# Patient Record
Sex: Female | Born: 1990 | Race: White | Hispanic: No | Marital: Single | Smoking: Current every day smoker
Health system: Southern US, Community
[De-identification: ages and names within clinical notes are randomized; demographics above are authoritative.]

## PROBLEM LIST (undated history)

## (undated) DIAGNOSIS — B009 Herpesviral infection, unspecified: Secondary | ICD-10-CM

## (undated) DIAGNOSIS — F319 Bipolar disorder, unspecified: Secondary | ICD-10-CM

## (undated) DIAGNOSIS — Z9151 Personal history of suicidal behavior: Secondary | ICD-10-CM

## (undated) DIAGNOSIS — Z915 Personal history of self-harm: Secondary | ICD-10-CM

## (undated) DIAGNOSIS — R51 Headache: Secondary | ICD-10-CM

## (undated) DIAGNOSIS — F909 Attention-deficit hyperactivity disorder, unspecified type: Secondary | ICD-10-CM

## (undated) DIAGNOSIS — F419 Anxiety disorder, unspecified: Secondary | ICD-10-CM

## (undated) HISTORY — DX: Attention-deficit hyperactivity disorder, unspecified type: F90.9

## (undated) HISTORY — DX: Anxiety disorder, unspecified: F41.9

## (undated) HISTORY — DX: Headache: R51

## (undated) HISTORY — PX: HAND SURGERY: SHX662

## (undated) HISTORY — DX: Bipolar disorder, unspecified: F31.9

---
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
## 2012-01-07 ENCOUNTER — Ambulatory Visit (INDEPENDENT_AMBULATORY_CARE_PROVIDER_SITE_OTHER): Payer: Medicaid Other | Admitting: Behavioral Health

## 2012-01-07 DIAGNOSIS — F909 Attention-deficit hyperactivity disorder, unspecified type: Secondary | ICD-10-CM

## 2012-01-07 DIAGNOSIS — F902 Attention-deficit hyperactivity disorder, combined type: Secondary | ICD-10-CM

## 2012-01-07 DIAGNOSIS — F319 Bipolar disorder, unspecified: Secondary | ICD-10-CM

## 2012-01-08 ENCOUNTER — Encounter (HOSPITAL_COMMUNITY): Payer: Self-pay | Admitting: Behavioral Health
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
## 2012-01-21 ENCOUNTER — Ambulatory Visit (HOSPITAL_COMMUNITY): Payer: Medicaid Other | Admitting: Behavioral Health

## 2012-02-05 ENCOUNTER — Ambulatory Visit (INDEPENDENT_AMBULATORY_CARE_PROVIDER_SITE_OTHER): Payer: Medicaid Other | Admitting: Behavioral Health

## 2012-02-05 DIAGNOSIS — F3110 Bipolar disorder, current episode manic without psychotic features, unspecified: Secondary | ICD-10-CM

## 2012-02-06 ENCOUNTER — Encounter (HOSPITAL_COMMUNITY): Payer: Self-pay | Admitting: Behavioral Health
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
## 2012-02-22 ENCOUNTER — Encounter (HOSPITAL_COMMUNITY): Payer: Self-pay | Admitting: Behavioral Health

## 2012-02-22 ENCOUNTER — Ambulatory Visit (HOSPITAL_COMMUNITY): Payer: Self-pay | Admitting: Behavioral Health
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
## 2012-03-10 ENCOUNTER — Ambulatory Visit (HOSPITAL_COMMUNITY): Payer: Self-pay | Admitting: Behavioral Health

## 2012-03-17 ENCOUNTER — Ambulatory Visit (INDEPENDENT_AMBULATORY_CARE_PROVIDER_SITE_OTHER): Payer: Medicaid Other | Admitting: Behavioral Health

## 2012-03-17 DIAGNOSIS — F3110 Bipolar disorder, current episode manic without psychotic features, unspecified: Secondary | ICD-10-CM

## 2012-03-18 ENCOUNTER — Encounter (HOSPITAL_COMMUNITY): Payer: Self-pay | Admitting: Behavioral Health
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
## 2012-03-26 ENCOUNTER — Ambulatory Visit (INDEPENDENT_AMBULATORY_CARE_PROVIDER_SITE_OTHER): Payer: Medicaid Other | Admitting: Behavioral Health

## 2012-03-26 ENCOUNTER — Encounter (HOSPITAL_COMMUNITY): Payer: Self-pay | Admitting: Behavioral Health

## 2012-03-26 DIAGNOSIS — F3110 Bipolar disorder, current episode manic without psychotic features, unspecified: Secondary | ICD-10-CM
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
## 2012-04-03 ENCOUNTER — Ambulatory Visit (INDEPENDENT_AMBULATORY_CARE_PROVIDER_SITE_OTHER): Payer: Medicaid Other | Admitting: Psychiatry

## 2012-04-03 ENCOUNTER — Encounter (HOSPITAL_COMMUNITY): Payer: Self-pay | Admitting: Emergency Medicine

## 2012-04-03 ENCOUNTER — Emergency Department (HOSPITAL_COMMUNITY)
Admission: EM | Admit: 2012-04-03 | Discharge: 2012-04-04 | Disposition: A | Discharge status: Home / Self Care | Payer: Medicaid Other | Attending: Emergency Medicine | Admitting: Emergency Medicine

## 2012-04-03 ENCOUNTER — Telehealth (HOSPITAL_COMMUNITY): Payer: Self-pay | Admitting: Behavioral Health

## 2012-04-03 ENCOUNTER — Encounter (HOSPITAL_COMMUNITY): Payer: Self-pay | Admitting: Behavioral Health

## 2012-04-03 ENCOUNTER — Encounter (HOSPITAL_COMMUNITY): Payer: Self-pay | Admitting: Psychiatry

## 2012-04-03 VITALS — BP 113/84 | HR 81 | Ht 60.5 in | Wt 110.0 lb

## 2012-04-03 DIAGNOSIS — F17200 Nicotine dependence, unspecified, uncomplicated: Secondary | ICD-10-CM | POA: Insufficient documentation

## 2012-04-03 DIAGNOSIS — F4320 Adjustment disorder, unspecified: Secondary | ICD-10-CM

## 2012-04-03 DIAGNOSIS — F3130 Bipolar disorder, current episode depressed, mild or moderate severity, unspecified: Secondary | ICD-10-CM | POA: Insufficient documentation

## 2012-04-03 DIAGNOSIS — F411 Generalized anxiety disorder: Secondary | ICD-10-CM | POA: Insufficient documentation

## 2012-04-03 DIAGNOSIS — F3110 Bipolar disorder, current episode manic without psychotic features, unspecified: Secondary | ICD-10-CM

## 2012-04-03 DIAGNOSIS — F909 Attention-deficit hyperactivity disorder, unspecified type: Secondary | ICD-10-CM | POA: Insufficient documentation

## 2012-04-03 LAB — CBC
HCT: 40.6 % (ref 36.0–46.0)
Hemoglobin: 13.9 g/dL (ref 12.0–15.0)
MCHC: 34.2 g/dL (ref 30.0–36.0)
RBC: 4.78 MIL/uL (ref 3.87–5.11)
WBC: 11.2 10*3/uL — ABNORMAL HIGH (ref 4.0–10.5)

## 2012-04-03 LAB — COMPREHENSIVE METABOLIC PANEL
ALT: 10 U/L (ref 0–35)
Alkaline Phosphatase: 78 U/L (ref 39–117)
BUN: 8 mg/dL (ref 6–23)
CO2: 21 mEq/L (ref 19–32)
Chloride: 103 mEq/L (ref 96–112)
GFR calc Af Amer: >90 mL/min (ref >90)
GFR calc non Af Amer: >90 mL/min (ref >90)
Glucose, Bld: 116 mg/dL — ABNORMAL HIGH (ref 70–99)
Potassium: 3.5 mEq/L (ref 3.5–5.1)
Total Bilirubin: 0.4 mg/dL (ref 0.3–1.2)

## 2012-04-03 LAB — ETHANOL: Alcohol, Ethyl (B): <11 mg/dL (ref 0–11)

## 2012-04-03 LAB — PREGNANCY, URINE: Preg Test, Ur: NEGATIVE

## 2012-04-03 LAB — RAPID URINE DRUG SCREEN, HOSP PERFORMED: Barbiturates: NOT DETECTED

## 2012-04-03 MED ORDER — NICOTINE 21 MG/24HR TD PT24
21.0000 mg | MEDICATED_PATCH | Freq: Every day | TRANSDERMAL | Status: DC
  Administered 2012-04-03: 21 mg via TRANSDERMAL
  Filled 2012-04-03: qty 1

## 2012-04-03 MED ORDER — ACETAMINOPHEN 325 MG PO TABS: 650.0000 mg | ORAL_TABLET | ORAL | Status: DC | PRN

## 2012-04-03 MED ORDER — QUETIAPINE FUMARATE 50 MG PO TABS
50.0000 mg | ORAL_TABLET | Freq: Every day | ORAL | Status: DC
  Administered 2012-04-03: 50 mg via ORAL
  Filled 2012-04-03: qty 1

## 2012-04-03 MED ORDER — LITHIUM CARBONATE 300 MG PO CAPS
600.0000 mg | ORAL_CAPSULE | Freq: Every day | ORAL | Status: DC
  Administered 2012-04-03: 600 mg via ORAL
  Filled 2012-04-03: qty 2

## 2012-04-03 MED ORDER — ALUM & MAG HYDROXIDE-SIMETH 200-200-20 MG/5ML PO SUSP: 30.0000 mL | ORAL | Status: DC | PRN

## 2012-04-03 MED ORDER — ZOLPIDEM TARTRATE 5 MG PO TABS
5.0000 mg | ORAL_TABLET | Freq: Every evening | ORAL | Status: DC | PRN
  Administered 2012-04-03: 5 mg via ORAL
  Filled 2012-04-03: qty 1

## 2012-04-03 MED ORDER — SERTRALINE HCL 50 MG PO TABS
150.0000 mg | ORAL_TABLET | Freq: Every day | ORAL | Status: DC
  Administered 2012-04-03: 150 mg via ORAL
  Filled 2012-04-03: qty 3

## 2012-04-03 MED ORDER — ONDANSETRON HCL 4 MG PO TABS: 4.0000 mg | ORAL_TABLET | Freq: Three times a day (TID) | ORAL | Status: DC | PRN

## 2012-04-03 MED ORDER — IBUPROFEN 600 MG PO TABS: 600.0000 mg | ORAL_TABLET | Freq: Three times a day (TID) | ORAL | Status: DC | PRN
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
## 2012-04-03 NOTE — ED Notes (Signed)
Per pt increased depression since miscarriage 2 days ago-tried to kill self 2 months ago by cutting self

## 2012-04-03 NOTE — ED Notes (Signed)
Patient sitting up on side of the bed.

## 2012-04-03 NOTE — Telephone Encounter (Signed)
I received a message from the clients grandmother, Haley Wheeler, to call me back in regards to the client requesting to go to the hospital. I spoke with Dr.Puthevel who met with the client today for the first time. He indicated that there was some depression there do to the client miscarrying but indicated that he was no conversation about the client being suicidal and she did not mention hospitalization. I did call Ms. his wife back who indicated that the client told her mother and grandmother that she felt like she needed some additional help and that she felt she needed to get the help in the hospital. I then spoke to the client. She indicated that she was depressed after miscarrying but that she was not suicidal, was not having suicidal thoughts, was not participating in any self-injurious behavior and did contract for safety. I did instruct her that she would need to go to the emergency room for medical clearance and that she would be referred from the emergency room to the psychiatric or behavioral health hospital if they felt that would be beneficial to her. I suggested to home health but she indicated that she would prefer to go to [facility]. I asked her to please call and let me know what took place. Also attempted to prepare her for the fact that she may not be accepted to the behavioral health hospital. She indicated that she understood that and again contracted for safety.

## 2012-04-03 NOTE — ED Provider Notes (Signed)
Medical screening examination/treatment/procedure(s) were performed by non-physician practitioner and as supervising physician I was immediately available for consultation/collaboration.    Nelia Shi, MD 04/03/12 2037
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
## 2012-04-03 NOTE — ED Provider Notes (Signed)
History     CSN: [id number]  Arrival date & time 04/03/12  1752   First MD Initiated Contact with Patient 04/03/12 1839      Chief Complaint  Patient presents with  . V70.1    (Consider location/radiation/quality/duration/timing/severity/associated sxs/prior treatment) HPI  Pt presents to the ED requesting inpatient psychiatric treatment. She states that she is bipolar disorder. Has no medical complaints at this time. But states she cannot go home without some mental help. Her mother is here to support her. She was told that she had a miscarriage two days ago and that has aggravated her bipolar disorder.  The patient is not suicidal or homicidal at this time. NAD and VSS  Past Medical History  Diagnosis Date  . ADHD (attention deficit hyperactivity disorder)   . Anxiety   . Bipolar disorder     Past Surgical History  Procedure Date  . Hand surgery     Family History  Problem Relation Age of Onset  . Depression Mother   . ADD / ADHD Father   . Alcohol abuse Father   . ADD / ADHD Brother     History  Substance Use Topics  . Smoking status: Current Everyday Smoker -- 2.0 packs/day for 3 years    Types: Cigarettes  . Smokeless tobacco: Never Used   Comment: Pateint plans to get electronic cigarette  . Alcohol Use: No     Last drink-more that 5 drinks.    OB History    Grav Para Term Preterm Abortions TAB SAB Ect Mult Living                  Review of Systems   HEENT: denies blurry vision or change in hearing PULMONARY: Denies difficulty breathing and SOB CARDIAC: denies chest pain or heart palpitations MUSCULOSKELETAL:  denies being unable to ambulate ABDOMEN AL: denies abdominal pain GU: denies loss of bowel or urinary control NEURO: denies numbness and tingling in extremities      Allergies  Review of patient's allergies indicates no known allergies.  Home Medications   Current Outpatient Rx  Name Route Sig Dispense Refill  . LITHIUM  CARBONATE 300 MG PO CAPS Oral Take 600 mg by mouth at bedtime.    Marland Kitchen POTASSIUM CHLORIDE CRYS ER 10 MEQ PO TBCR Oral Take 10 mEq by mouth 2 (two) times daily.    Marland Kitchen PRENATAL MULTIVITAMIN CH Oral Take 1 tablet by mouth daily.    . QUETIAPINE FUMARATE 50 MG PO TABS Oral Take 50 mg by mouth at bedtime.    . SERTRALINE HCL 100 MG PO TABS Oral Take 150 mg by mouth daily.      BP 133/103  Pulse 91  Temp 98.4 F (36.9 C) (Oral)  Resp 17  Ht 5' (1.524 m)  Wt 108 lb (48.988 kg)  BMI 21.09 kg/m2  SpO2 100%  LMP 08/04/2011  Physical Exam  Nursing note and vitals reviewed. Constitutional: She appears well-developed and well-nourished. No distress.  HENT:  Head: Normocephalic and atraumatic.  Eyes: Pupils are equal, round, and reactive to light.  Neck: Normal range of motion. Neck supple.  Cardiovascular: Normal rate and regular rhythm.   Pulmonary/Chest: Effort normal.  Abdominal: Soft.  Neurological: She is alert.  Skin: Skin is warm and dry.  Psychiatric: She exhibits a depressed mood. She expresses no homicidal and no suicidal ideation. She expresses no suicidal plans and no homicidal plans.    ED Course  Procedures (including critical care time)  Labs Reviewed  CBC - Abnormal; Notable for the following:    WBC 11.2 (*)     All other components within normal limits  COMPREHENSIVE METABOLIC PANEL  ETHANOL  URINE RAPID DRUG SCREEN (HOSP PERFORMED)   No results found.   1. Depression       MDM  ACT to consult        Dorthula Matas, PA 04/03/12 1915  Dorthula Matas, PA 04/03/12 1918

## 2012-04-04 NOTE — ED Notes (Signed)
Patient discharge to home with written and verbal instructions. Respirations equal and unlabored. Skin warm and dry. No acute distress noted.

## 2012-04-04 NOTE — Discharge Instructions (Signed)
Follow up with the [facility] later this morning.  You may also use resource guide provided. Also follow up with primary care doctor in coming week.  Return to ER if worse, new symptoms, other concern.        Adjustment Disorder Most changes in life can cause stress. Getting used to changes may take a few months or longer. If feelings of stress, hopelessness, or worry continue, you may have an adjustment disorder. This stress-related mental health problem may affect your feelings, thinking and how you act. It occurs in both sexes and happens at any age. SYMPTOMS  Some of the following problems may be seen and vary from person to person:  Sadness or depression.   Loss of enjoyment.   Thoughts of suicide.   Fighting.   Avoiding family and friends.   Poor school performance.   Hopelessness, sense of loss.   Trouble sleeping.   Vandalism.   Worry, weight loss or gain.   Crying spells.   Anxiety   Reckless driving.   Skipping school.   Poor work International aid/development worker.   Nervousness.   Ignoring bills.   Poor attitude.  DIAGNOSIS  Your caregiver will ask what has happened in your life and do a physical exam. They will make a diagnosis of an adjustment disorder when they are sure another problem or medical illness causing your feelings does not exist. TREATMENT  When problems caused by stress interfere with you daily life or last longer than a few months, you may need counseling for an adjustment disorder. Early treatment may diminish problems and help you to better cope with the stressful events in your life. Sometimes medication is necessary. Individual counseling and or support groups can be very helpful. PROGNOSIS  Adjustment disorders usually last less than 3 to 6 months. The condition may persist if there is long lasting stress. This could include health problems, relationship problems, or job difficulties where you can not easily escape from what is causing the  problem. PREVENTION  Even the most mentally healthy, highly functioning people can suffer from an adjustment disorder given a significant blow from a life-changing event. There is no way to prevent pain and loss. Most people need help from time to time. You are not alone. SEEK MEDICAL CARE IF:  Your feelings or symptoms listed above do not improve or worsen. Document Released: 05/29/2006 Document Revised: 09/13/2011 Document Reviewed: 08/20/2007 [facility] Patient Information 2012 [place], [place].      Borderline Personality Disorder Borderline personality disorder is a mental health disorder. People with borderline personality disorder have unhealthy patterns of perceiving, thinking about, and reacting to their environment and events in their life. These patterns are established by adolescence or early adulthood. People with borderline personality disorder also have difficulty coping with stress on their own and fear being abandoned by others. They have difficulty controlling their emotions. Their emotions change quickly, frequently, and intensely. They are easily upset and can become very angry, very suddenly. Their unpredictable behavior often leads to problems in their relationships. They often feel worthless, unloved, and emotionally empty. CAUSES No one knows the exact cause of borderline personality disorder. Most mental health experts think that there is more than one cause. Possible contributing factors include:  Genetic factors. These are traits that are passed down from one generation to the next. Many people with borderline personality disorder have a family history of the disorder.   Physical factors. The part of the brain that controls emotion may be different in  people who have borderline personality disorder.   Social factors. Traumatic experiences involving other people may play a role in the development of borderline personality disorder. Examples include neglect, abandonment,  and physical and sexual abuse.  SYMPTOMS Signs and symptoms of borderline personality disorder include:  A series of unstable personal relationships.   A strong fear of being abandoned and frantic efforts to avoid abandonment.   Impulsive, self-destructive behavior, such as substance abuse, irrational spending of money, unprotected sex with multiple partners, reckless driving, and binge eating.   Poor self-image that also may change a lot, or a sense of identity that is inconsistent.   Recurring self-injury or attempted suicide.   Severe mood swings, including depression, irritability, and anxiety.   Lasting feelings of emptiness.   Difficulty controlling anger.   Temporary feelings of paranoia or loss of touch with reality.  DIAGNOSIS A diagnosis of borderline personality disorder requires the presence of at least 5 of the common signs and symptoms. This information is gathered from family and friends as well as medical professionals and legal professionals who have a close association with the patient. This information is also gathered during a psychiatric assessment. During the assessment, the patient is asked about early life experiences, level of education, employment status, physical health conditions, and current prescription and over-the-counter medicines used. TREATMENT Caregivers who usually treat borderline personality disorder are mental health professionals, such as psychologists, psychiatrists, and clinical social workers. More than one type of treatment may be needed. Types of treatment include:  Psychotherapy (also known as talk therapy or counseling).   Cognitive behavioral therapy. This helps the person to recognize and change unhealthy feelings, thoughts, and behaviors. They find new, more positive thoughts and actions to replace the old ones.   Dialectical behavioral therapy. Through this type of treatment, a person learns to understand his or her feelings and to  regulate them. This may be one-on-one treatment or part of group therapy.   Family therapy. This treatment includes family members.   Medicine. Medicine may be used to help control emotions, reduce reckless and self-destructive behavior, treat anxiety, and treat depression.  SEEK IMMEDIATE MEDICAL CARE IF:   You cannot control your behavior or emotions.   You think about hurting yourself.   You think about suicide.  FOR MORE INFORMATION National Alliance on Mental Illness: www.nami.org [place] [employer] of Mental Health: http://www.maynard.net/ Borderline Personality Resource Center: http://bpdresourcecenter.org Document Released: 01/09/2011 Document Revised: 09/13/2011 Document Reviewed: 01/09/2011 [facility] Patient Information 2012 [place], [place].     Depression, Adolescent and Adult Depression is a true and treatable medical condition. In general there are two kinds of depression:  Depression we all experience in some form. For example depression from the death of a loved one, financial distress or natural disasters will trigger or increase depression.   Clinical depression, on the other hand, appears without an apparent cause or reason. This depression is a disease. Depression may be caused by chemical imbalance in the body and brain or may come as a response to a physical illness. Alcohol and other drugs can cause depression.  DIAGNOSIS  The diagnosis of depression is usually based upon symptoms and medical history. TREATMENT  Treatments for depression fall into three categories. These are:  Drug therapy. There are many medicines that treat depression. Responses may vary and sometimes trial and error is necessary to determine the best medicines and dosage for a particular patient.   Psychotherapy, also called talking treatments, helps people resolve their problems  by looking at them from a different point of view and by giving people insight into their own personal makeup.  Traditional psychotherapy looks at a childhood source of a problem. Other psychotherapy will look at current conflicts and move toward solving those. If the cause of depression is drug use, counseling is available to help abstain. In time the depression will usually improve. If there were underlying causes for the chemical use, they can be addressed.   ECT (electroconvulsive therapy) or shock treatment is not as commonly used today. It is a very effective treatment for severe suicidal depression. During ECT electrical impulses are applied to the head. These impulses cause a generalized seizure. It can be effective but causes a loss of memory for recent events. Sometimes this loss of memory may include the last several months.  Treat all depression or suicide threats as serious. Obtain professional help. Do not wait to see if serious depression will get better over time without help. Seek help for yourself or those around you. In the [place] the number to the National Suicide Help Lines With 24 Hour Help Are: 1-800-SUICIDE 315 262 9102 Document Released: 09/21/2000 Document Revised: 09/13/2011 Document Reviewed: 05/12/2008 [facility] Patient Information 2012 [place], [place].

## 2012-04-04 NOTE — ED Notes (Signed)
Telepsych completed.  

## 2012-04-04 NOTE — ED Provider Notes (Signed)
Pt has been evaluated by psychiatrist, Dr Jacky Kindle, who states pt stable for d/c, recommends d/c to home with referral to community mental health follow up.   Suzi Roots, MD 04/04/12 [id number]

## 2012-04-08 ENCOUNTER — Encounter (HOSPITAL_COMMUNITY): Payer: Self-pay | Admitting: Emergency Medicine

## 2012-04-24 ENCOUNTER — Ambulatory Visit (HOSPITAL_COMMUNITY): Payer: Self-pay | Admitting: Behavioral Health

## 2012-04-30 ENCOUNTER — Encounter (HOSPITAL_COMMUNITY): Payer: Self-pay | Admitting: Behavioral Health

## 2012-04-30 ENCOUNTER — Ambulatory Visit (INDEPENDENT_AMBULATORY_CARE_PROVIDER_SITE_OTHER): Payer: 59 | Admitting: Behavioral Health

## 2012-04-30 DIAGNOSIS — F902 Attention-deficit hyperactivity disorder, combined type: Secondary | ICD-10-CM

## 2012-04-30 DIAGNOSIS — F909 Attention-deficit hyperactivity disorder, unspecified type: Secondary | ICD-10-CM

## 2012-04-30 DIAGNOSIS — F3110 Bipolar disorder, current episode manic without psychotic features, unspecified: Secondary | ICD-10-CM
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
## 2012-05-01 ENCOUNTER — Encounter (HOSPITAL_COMMUNITY): Payer: Self-pay | Admitting: Psychiatry

## 2012-05-01 ENCOUNTER — Ambulatory Visit (INDEPENDENT_AMBULATORY_CARE_PROVIDER_SITE_OTHER): Payer: Medicaid Other | Admitting: Psychiatry

## 2012-05-01 VITALS — BP 117/78 | HR 67 | Ht 60.5 in | Wt 106.0 lb

## 2012-05-01 DIAGNOSIS — F319 Bipolar disorder, unspecified: Secondary | ICD-10-CM

## 2012-05-01 DIAGNOSIS — F3110 Bipolar disorder, current episode manic without psychotic features, unspecified: Secondary | ICD-10-CM
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
## 2012-05-02 ENCOUNTER — Ambulatory Visit (HOSPITAL_COMMUNITY): Payer: Self-pay | Admitting: Behavioral Health

## 2012-05-14 ENCOUNTER — Ambulatory Visit (INDEPENDENT_AMBULATORY_CARE_PROVIDER_SITE_OTHER): Payer: 59 | Admitting: Behavioral Health

## 2012-05-14 ENCOUNTER — Ambulatory Visit (HOSPITAL_COMMUNITY): Payer: Self-pay | Admitting: Psychiatry

## 2012-05-14 DIAGNOSIS — F3110 Bipolar disorder, current episode manic without psychotic features, unspecified: Secondary | ICD-10-CM

## 2012-05-16 ENCOUNTER — Encounter (HOSPITAL_COMMUNITY): Payer: Self-pay | Admitting: Behavioral Health
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
## 2012-05-19 ENCOUNTER — Ambulatory Visit (INDEPENDENT_AMBULATORY_CARE_PROVIDER_SITE_OTHER): Payer: Medicaid Other | Admitting: Psychiatry

## 2012-05-19 ENCOUNTER — Encounter (HOSPITAL_COMMUNITY): Payer: Self-pay | Admitting: Psychiatry

## 2012-05-19 VITALS — BP 100/64 | HR 67 | Ht 60.5 in | Wt 106.0 lb

## 2012-05-19 DIAGNOSIS — F319 Bipolar disorder, unspecified: Secondary | ICD-10-CM

## 2012-05-19 MED ORDER — LITHIUM CARBONATE 300 MG PO CAPS: ORAL_CAPSULE | ORAL | Status: DC
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
## 2012-05-27 ENCOUNTER — Telehealth (HOSPITAL_COMMUNITY): Payer: Self-pay

## 2012-05-27 DIAGNOSIS — F319 Bipolar disorder, unspecified: Secondary | ICD-10-CM

## 2012-06-03 NOTE — Telephone Encounter (Signed)
Called patient. Advised her to come to lab to have lithium levels draw. Told her to come to the clinic to collect the lab form.

## 2012-06-04 ENCOUNTER — Emergency Department (INDEPENDENT_AMBULATORY_CARE_PROVIDER_SITE_OTHER)
Admission: EM | Admit: 2012-06-04 | Discharge: 2012-06-04 | Disposition: A | Discharge status: Home / Self Care | Payer: Medicaid Other | Source: Home / Self Care

## 2012-06-04 ENCOUNTER — Encounter: Payer: Self-pay | Admitting: *Deleted

## 2012-06-04 ENCOUNTER — Ambulatory Visit (INDEPENDENT_AMBULATORY_CARE_PROVIDER_SITE_OTHER): Payer: 59 | Admitting: Behavioral Health

## 2012-06-04 ENCOUNTER — Encounter (HOSPITAL_COMMUNITY): Payer: Self-pay | Admitting: Behavioral Health

## 2012-06-04 DIAGNOSIS — F3110 Bipolar disorder, current episode manic without psychotic features, unspecified: Secondary | ICD-10-CM

## 2012-06-04 DIAGNOSIS — B373 Candidiasis of vulva and vagina: Secondary | ICD-10-CM

## 2012-06-04 DIAGNOSIS — N390 Urinary tract infection, site not specified: Secondary | ICD-10-CM

## 2012-06-04 LAB — POCT URINALYSIS DIP (MANUAL ENTRY)
Blood, UA: NEGATIVE
Glucose, UA: NEGATIVE
Ketones, POC UA: NEGATIVE
Spec Grav, UA: 1.02 (ref 1.005–1.03)
Urobilinogen, UA: 1 (ref 0–1)

## 2012-06-04 MED ORDER — FLUCONAZOLE 150 MG PO TABS: 150.0000 mg | ORAL_TABLET | Freq: Once | ORAL | Status: DC

## 2012-06-04 MED ORDER — NITROFURANTOIN MONOHYD MACRO 100 MG PO CAPS: 100.0000 mg | ORAL_CAPSULE | Freq: Two times a day (BID) | ORAL | Status: AC

## 2012-06-04 MED ORDER — FLUCONAZOLE 150 MG PO TABS: 150.0000 mg | ORAL_TABLET | Freq: Once | ORAL | Status: AC
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
## 2012-06-04 NOTE — ED Notes (Signed)
Pt c/o dysuria, vaginal d/c and foul odor of her urine x 2 days. No OTC meds.

## 2012-06-04 NOTE — ED Provider Notes (Signed)
Agree with exam, assessment, and plan.   Lattie Haw, MD 06/04/12 1929

## 2012-06-04 NOTE — ED Provider Notes (Signed)
History     CSN: [id number]  Arrival date & time 06/04/12  1424      Chief Complaint  Patient presents with  . Dysuria  . Vaginal Itching    HPI Comments: Patient presents today with a complain of dysuria, cloudy urine, and foul odor to urine for 2 days.  She also has a complaint of yellowish vaginal dischage and itching for 2 days as well.  She denies any fever, chills.  Denies lower back pain and lower abdominal pain.  LMP - 7 days ago.  She is sexually active.  She relates no symptoms noted on partner.  The history is provided by the patient.    Past Medical History  Diagnosis Date  . ADHD (attention deficit hyperactivity disorder)   . Anxiety   . Bipolar disorder     Past Surgical History  Procedure Date  . Hand surgery     Family History  Problem Relation Age of Onset  . Depression Mother   . ADD / ADHD Father   . Alcohol abuse Father   . ADD / ADHD Brother     History  Substance Use Topics  . Smoking status: Current Everyday Smoker -- 2.0 packs/day for 3 years    Types: Cigarettes  . Smokeless tobacco: Never Used   Comment: Patient plans to get electronic cigarette  . Alcohol Use: No     12/wk    OB History    Grav Para Term Preterm Abortions TAB SAB Ect Mult Living                  Review of Systems  Constitutional: Negative.   HENT: Negative.   Eyes: Negative.   Cardiovascular: Negative.   Gastrointestinal: Negative for nausea and abdominal pain.  Genitourinary: Positive for dysuria and vaginal discharge. Negative for frequency, hematuria, flank pain, difficulty urinating and pelvic pain.       Yellowish vaginal discharge for 2 days.  All other systems reviewed and are negative.    Allergies  Review of patient's allergies indicates no known allergies.  Home Medications   Current Outpatient Rx  Name Route Sig Dispense Refill  . FLUCONAZOLE 150 MG PO TABS Oral Take 1 tablet (150 mg total) by mouth once. 1 tablet 1  . LITHIUM CARBONATE  300 MG PO CAPS  Take one tablet in the morning and two tablet at bedtime. 90 capsule 1  . NITROFURANTOIN MONOHYD MACRO 100 MG PO CAPS Oral Take 1 capsule (100 mg total) by mouth 2 (two) times daily. 14 capsule 0  . VALACYCLOVIR HCL 1 G PO TABS        BP 108/73  Pulse 78  Temp 98.7 F (37.1 C) (Oral)  Resp 16  Ht 5' (1.524 m)  Wt 106 lb (48.081 kg)  BMI 20.70 kg/m2  SpO2 99%  Physical Exam  Nursing note and vitals reviewed. Constitutional: She appears well-developed and well-nourished.  HENT:  Head: Normocephalic and atraumatic.  Right Ear: External ear normal.  Left Ear: External ear normal.  Nose: Nose normal.  Mouth/Throat: Oropharynx is clear and moist.  Neck: Normal range of motion. Neck supple. No thyromegaly present.  Cardiovascular: Normal rate, regular rhythm, normal heart sounds and intact distal pulses.   Pulmonary/Chest: Effort normal and breath sounds normal. She has no wheezes. She has no rales.  Abdominal: Soft. Bowel sounds are normal. There is tenderness in the suprapubic area. There is no rigidity and no CVA tenderness.  Some discomfort in suprapubic area with palpation.  Genitourinary: Uterus normal. Cervix exhibits discharge. Cervix exhibits no motion tenderness.       Thick white chunky discharge in vulvovaginal cavity. Cervix is pink.           ED Course  Procedures   Labs Reviewed Wet prep/KOH: Demonstrated budding yeast.  No clue cells, no white blood cells, no trichomonas present.    PH of 6.0.   POCT URINALYSIS DIP (MANUAL ENTRY):   Glu - neg Bil - neg Ket- neg SG - 1.020 Blood - neg pH - 7.0 Protein -neg Uro - 1.0 Nit - neg Leu - small       1. UTI (urinary tract infection)   2. Candida vaginitis       MDM  This is a UTI with Candida vaginitis.  RX for Macrobid 100 MG tab, take 1 tab 2 times a day for 7 days. RX for Diflucan 150 mg tab, take 1 tab now.  May then take 1 tablet after completion of  Macrobid. GC/Chlamydia: Pending.        Junius Roads, NP 06/04/12 1631

## 2012-06-05 LAB — GC/CHLAMYDIA PROBE AMP, GENITAL: GC Probe Amp, Genital: NEGATIVE

## 2012-06-06 LAB — URINE CULTURE
Colony Count: NO GROWTH
Organism ID, Bacteria: NO GROWTH

## 2012-06-11 ENCOUNTER — Telehealth: Payer: Self-pay | Admitting: *Deleted

## 2012-06-19 ENCOUNTER — Ambulatory Visit (INDEPENDENT_AMBULATORY_CARE_PROVIDER_SITE_OTHER): Payer: Medicaid Other | Admitting: Psychiatry

## 2012-06-19 ENCOUNTER — Encounter (HOSPITAL_COMMUNITY): Payer: Self-pay | Admitting: Psychiatry

## 2012-06-19 VITALS — BP 114/81 | HR 98 | Ht 60.5 in | Wt 102.0 lb

## 2012-06-19 DIAGNOSIS — F319 Bipolar disorder, unspecified: Secondary | ICD-10-CM

## 2012-06-19 LAB — LITHIUM LEVEL: Lithium Lvl: 0.3 mEq/L — ABNORMAL LOW (ref 0.80–1.40)

## 2012-06-19 LAB — CREATININE, SERUM: Creat: 0.73 mg/dL (ref 0.50–1.10)

## 2012-06-19 MED ORDER — LITHIUM CARBONATE 300 MG PO CAPS: ORAL_CAPSULE | ORAL | Status: DC
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
## 2012-06-30 NOTE — Telephone Encounter (Signed)
Reviewed with the patient

## 2012-07-01 ENCOUNTER — Ambulatory Visit (INDEPENDENT_AMBULATORY_CARE_PROVIDER_SITE_OTHER): Payer: 59 | Admitting: Behavioral Health

## 2012-07-01 ENCOUNTER — Encounter (HOSPITAL_COMMUNITY): Payer: Self-pay | Admitting: Behavioral Health

## 2012-07-01 DIAGNOSIS — F3110 Bipolar disorder, current episode manic without psychotic features, unspecified: Secondary | ICD-10-CM
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
## 2012-07-15 ENCOUNTER — Emergency Department
Admission: EM | Admit: 2012-07-15 | Discharge: 2012-07-15 | Discharge status: Absent without leave | Payer: Self-pay | Source: Home / Self Care

## 2012-07-21 ENCOUNTER — Encounter (HOSPITAL_COMMUNITY): Payer: Self-pay | Admitting: Psychiatry

## 2012-07-21 ENCOUNTER — Ambulatory Visit (INDEPENDENT_AMBULATORY_CARE_PROVIDER_SITE_OTHER): Payer: Medicaid Other | Admitting: Psychiatry

## 2012-07-21 VITALS — BP 115/86 | HR 92 | Ht 60.5 in | Wt 107.0 lb

## 2012-07-21 DIAGNOSIS — F319 Bipolar disorder, unspecified: Secondary | ICD-10-CM

## 2012-07-21 MED ORDER — LITHIUM CARBONATE 300 MG PO CAPS: ORAL_CAPSULE | ORAL | Status: DC
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
## 2012-07-25 ENCOUNTER — Ambulatory Visit (INDEPENDENT_AMBULATORY_CARE_PROVIDER_SITE_OTHER): Payer: Medicaid Other | Admitting: Behavioral Health

## 2012-07-25 ENCOUNTER — Encounter (HOSPITAL_COMMUNITY): Payer: Self-pay | Admitting: Behavioral Health

## 2012-07-25 DIAGNOSIS — F3110 Bipolar disorder, current episode manic without psychotic features, unspecified: Secondary | ICD-10-CM
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
## 2012-08-01 ENCOUNTER — Encounter (HOSPITAL_COMMUNITY): Payer: Self-pay | Admitting: Behavioral Health

## 2012-08-04 ENCOUNTER — Encounter (HOSPITAL_COMMUNITY): Payer: Self-pay | Admitting: Psychiatry

## 2012-08-04 ENCOUNTER — Ambulatory Visit (INDEPENDENT_AMBULATORY_CARE_PROVIDER_SITE_OTHER): Payer: Medicaid Other | Admitting: Psychiatry

## 2012-08-04 VITALS — BP 110/74 | HR 89 | Ht 60.5 in | Wt 106.0 lb

## 2012-08-04 DIAGNOSIS — F319 Bipolar disorder, unspecified: Secondary | ICD-10-CM

## 2012-08-04 MED ORDER — LITHIUM CARBONATE 300 MG PO CAPS: ORAL_CAPSULE | ORAL | Status: DC
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
## 2012-08-19 LAB — LITHIUM LEVEL: Lithium Lvl: 0.1 mEq/L — ABNORMAL LOW (ref 0.80–1.40)

## 2012-08-27 ENCOUNTER — Ambulatory Visit (HOSPITAL_COMMUNITY): Payer: Self-pay | Admitting: Behavioral Health

## 2012-08-28 ENCOUNTER — Telehealth (HOSPITAL_COMMUNITY): Payer: Self-pay

## 2012-08-28 NOTE — Telephone Encounter (Signed)
Called the patient's grandmother's she reports her husband did not want the patient committed. The patient reportedly called from the hospital and was supposedly in the hospital and being transferred to another facility.  I called [facility] and provided collateral information for the patient's provider, as patient is currently a danger to others.
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
## 2012-09-02 ENCOUNTER — Ambulatory Visit (HOSPITAL_COMMUNITY): Payer: Self-pay | Admitting: Psychiatry

## 2012-09-10 ENCOUNTER — Ambulatory Visit (INDEPENDENT_AMBULATORY_CARE_PROVIDER_SITE_OTHER): Payer: Medicaid Other | Admitting: Behavioral Health

## 2012-09-10 DIAGNOSIS — F319 Bipolar disorder, unspecified: Secondary | ICD-10-CM

## 2012-09-11 ENCOUNTER — Telehealth (HOSPITAL_COMMUNITY): Payer: Self-pay | Admitting: Psychiatry

## 2012-09-11 ENCOUNTER — Encounter (HOSPITAL_COMMUNITY): Payer: Self-pay | Admitting: Psychiatry

## 2012-09-11 ENCOUNTER — Encounter (HOSPITAL_COMMUNITY): Payer: Self-pay | Admitting: Behavioral Health

## 2012-09-11 ENCOUNTER — Ambulatory Visit (INDEPENDENT_AMBULATORY_CARE_PROVIDER_SITE_OTHER): Payer: Medicaid Other | Admitting: Psychiatry

## 2012-09-11 ENCOUNTER — Emergency Department
Admission: EM | Admit: 2012-09-11 | Discharge: 2012-09-11 | Disposition: A | Discharge status: Home / Self Care | Payer: Medicaid Other | Source: Home / Self Care | Attending: Family Medicine | Admitting: Family Medicine

## 2012-09-11 ENCOUNTER — Encounter: Payer: Self-pay | Admitting: *Deleted

## 2012-09-11 VITALS — BP 98/74 | HR 82 | Ht 60.5 in | Wt 104.5 lb

## 2012-09-11 DIAGNOSIS — N898 Other specified noninflammatory disorders of vagina: Secondary | ICD-10-CM

## 2012-09-11 DIAGNOSIS — F319 Bipolar disorder, unspecified: Secondary | ICD-10-CM

## 2012-09-11 DIAGNOSIS — F3110 Bipolar disorder, current episode manic without psychotic features, unspecified: Secondary | ICD-10-CM

## 2012-09-11 DIAGNOSIS — R300 Dysuria: Secondary | ICD-10-CM

## 2012-09-11 HISTORY — DX: Personal history of self-harm: Z91.5

## 2012-09-11 HISTORY — DX: Personal history of suicidal behavior: Z91.51

## 2012-09-11 LAB — POCT CBC W AUTO DIFF (K'VILLE URGENT CARE)

## 2012-09-11 LAB — POCT URINALYSIS DIP (MANUAL ENTRY)
Nitrite, UA: NEGATIVE
Protein Ur, POC: NEGATIVE
Urobilinogen, UA: 0.2 (ref 0–1)

## 2012-09-11 LAB — COMPREHENSIVE METABOLIC PANEL
ALT: 10 U/L (ref 0–35)
AST: 15 U/L (ref 0–37)
CO2: 26 mEq/L (ref 19–32)
Chloride: 104 mEq/L (ref 96–112)
Sodium: 138 mEq/L (ref 135–145)
Total Bilirubin: 0.2 mg/dL — ABNORMAL LOW (ref 0.3–1.2)
Total Protein: 7.3 g/dL (ref 6.0–8.3)

## 2012-09-11 MED ORDER — OXCARBAZEPINE 300 MG PO TABS: 600.0000 mg | ORAL_TABLET | Freq: Every day | ORAL | Status: DC

## 2012-09-11 MED ORDER — CEFTRIAXONE SODIUM 1 G IJ SOLR
1.0000 g | INTRAMUSCULAR | Status: DC
  Administered 2012-09-11: 1 g via INTRAMUSCULAR

## 2012-09-11 MED ORDER — FLUCONAZOLE 150 MG PO TABS: 150.0000 mg | ORAL_TABLET | Freq: Once | ORAL | Status: DC

## 2012-09-11 MED ORDER — CEPHALEXIN 500 MG PO CAPS: 500.0000 mg | ORAL_CAPSULE | Freq: Three times a day (TID) | ORAL | Status: AC

## 2012-09-11 MED ORDER — AZITHROMYCIN 500 MG PO TABS: ORAL_TABLET | ORAL | Status: DC

## 2012-09-11 MED ORDER — CEFTRIAXONE SODIUM 1 G IJ SOLR
1.0000 g | Freq: Once | INTRAMUSCULAR | Status: AC
  Administered 2012-09-11: 1 g via INTRAMUSCULAR

## 2012-09-11 MED ORDER — ZIPRASIDONE HCL 60 MG PO CAPS: 60.0000 mg | ORAL_CAPSULE | Freq: Two times a day (BID) | ORAL | Status: DC

## 2012-09-11 MED ORDER — METRONIDAZOLE 500 MG PO TABS: 500.0000 mg | ORAL_TABLET | Freq: Two times a day (BID) | ORAL | Status: DC
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
## 2012-09-11 NOTE — ED Notes (Signed)
Patient c/o 1 week of dysuria and polyuria. 2 weeks of left lower back pain.
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
## 2012-09-12 ENCOUNTER — Telehealth: Payer: Self-pay | Admitting: *Deleted

## 2012-09-12 LAB — DRUGS OF ABUSE SCREEN W/O ALC, ROUTINE URINE
Amphetamine Screen, Ur: NEGATIVE
Benzodiazepines.: NEGATIVE
Creatinine,U: 105.9 mg/dL
Methadone: NEGATIVE
Propoxyphene: NEGATIVE

## 2012-09-12 LAB — LITHIUM LEVEL: Lithium Lvl: <0.01 mEq/L — ABNORMAL LOW (ref 0.80–1.40)

## 2012-09-13 ENCOUNTER — Telehealth: Payer: Self-pay | Admitting: Emergency Medicine

## 2012-09-14 LAB — URINE CULTURE: Colony Count: >=100000

## 2012-09-17 ENCOUNTER — Telehealth (HOSPITAL_COMMUNITY): Payer: Self-pay

## 2012-09-17 NOTE — Telephone Encounter (Signed)
Error

## 2012-09-18 ENCOUNTER — Telehealth (HOSPITAL_COMMUNITY): Payer: Self-pay

## 2012-09-18 DIAGNOSIS — F3110 Bipolar disorder, current episode manic without psychotic features, unspecified: Secondary | ICD-10-CM

## 2012-09-18 MED ORDER — ZIPRASIDONE HCL 40 MG PO CAPS: 40.0000 mg | ORAL_CAPSULE | Freq: Every day | ORAL | Status: DC

## 2012-09-18 NOTE — Telephone Encounter (Signed)
The patient reports that she is tired. She went to bed 9 PM and around 3 PM.  The patient has improvement in her mood but feels 60 mg is too strong.  Will hold dose tonight and start 40 mg tomorrow.

## 2012-09-22 ENCOUNTER — Telehealth (HOSPITAL_COMMUNITY): Payer: Self-pay

## 2012-09-22 NOTE — Telephone Encounter (Addendum)
The patient's grandmother reports that the patient has stopped Geodon and does not want to continue this medication. She reports that 60 mg was too sedating. She continues to take Trileptal. Grandmother denies any violent behavior and feels the patient is doing better than she had in the past.   PLAN:  1. Affirm with the patient that the medications are taken as ordered. Patient  expressed understanding of how their medications were to be used.  2. Continue the following psychiatric medications  with the following changes:  a) Continue trileptal, with increase if needed secondary to mood symptoms. B) Advised patient to restart ziprasidone at 40 mg if she started to have mood symptoms.  3. Risks and benefits, side effects and alternatives discussed with patient, she was given an opportunity to ask questions about her medication, illness, and treatment. I specifically discussed the risk or being hospitalized secondary to patient's decision to discontinue ziprasidone due to a return of manic symptoms or aggressive/reckless behavior. P 4. Patient told to call clinic if any problems occur. Patient advised to go to ER  if s/he should develop SI/HI, side effects, or if symptoms worsen. Has crisis numbers to call if needed.  5. Will order trileptal levels at next visit to ascertain if patient is taking trileptal on a regular basis. 10. The patient expressed understanding of the plan and agrees with the above.

## 2012-09-24 ENCOUNTER — Ambulatory Visit (INDEPENDENT_AMBULATORY_CARE_PROVIDER_SITE_OTHER): Payer: Medicaid Other | Admitting: Behavioral Health

## 2012-09-24 DIAGNOSIS — F319 Bipolar disorder, unspecified: Secondary | ICD-10-CM

## 2012-09-25 ENCOUNTER — Encounter (HOSPITAL_COMMUNITY): Payer: Self-pay | Admitting: Behavioral Health
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
## 2012-09-29 ENCOUNTER — Encounter (HOSPITAL_COMMUNITY): Payer: Self-pay | Admitting: Psychiatry

## 2012-09-29 ENCOUNTER — Ambulatory Visit (INDEPENDENT_AMBULATORY_CARE_PROVIDER_SITE_OTHER): Payer: Medicaid Other | Admitting: Psychiatry

## 2012-09-29 VITALS — BP 107/80 | HR 90 | Ht 60.0 in | Wt 101.0 lb

## 2012-09-29 DIAGNOSIS — F3110 Bipolar disorder, current episode manic without psychotic features, unspecified: Secondary | ICD-10-CM

## 2012-09-29 DIAGNOSIS — F319 Bipolar disorder, unspecified: Secondary | ICD-10-CM

## 2012-09-29 MED ORDER — OXCARBAZEPINE 300 MG PO TABS: ORAL_TABLET | ORAL | Status: DC

## 2012-09-29 MED ORDER — OXCARBAZEPINE 300 MG PO TABS: 900.0000 mg | ORAL_TABLET | Freq: Every day | ORAL | Status: DC
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
## 2012-10-06 ENCOUNTER — Telehealth: Payer: Self-pay | Admitting: Emergency Medicine

## 2012-10-27 ENCOUNTER — Ambulatory Visit (HOSPITAL_COMMUNITY): Payer: Self-pay | Admitting: Behavioral Health

## 2012-10-30 ENCOUNTER — Encounter (HOSPITAL_COMMUNITY): Payer: Self-pay | Admitting: Psychiatry

## 2012-10-30 ENCOUNTER — Ambulatory Visit (INDEPENDENT_AMBULATORY_CARE_PROVIDER_SITE_OTHER): Payer: 59 | Admitting: Psychiatry

## 2012-10-30 VITALS — BP 101/72 | HR 93 | Ht 60.0 in | Wt 102.0 lb

## 2012-10-30 DIAGNOSIS — F3110 Bipolar disorder, current episode manic without psychotic features, unspecified: Secondary | ICD-10-CM

## 2012-10-30 DIAGNOSIS — F319 Bipolar disorder, unspecified: Secondary | ICD-10-CM

## 2012-10-30 MED ORDER — OXCARBAZEPINE 300 MG PO TABS: ORAL_TABLET | ORAL | Status: DC
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
## 2012-10-31 LAB — DRUG SCREEN, URINE
Amphetamine Screen, Ur: NEGATIVE
Barbiturate Quant, Ur: NEGATIVE
Cocaine Metabolites: NEGATIVE
Methadone: NEGATIVE
Opiates: NEGATIVE

## 2012-10-31 LAB — CBC WITH DIFFERENTIAL/PLATELET
Basophils Absolute: 0 10*3/uL (ref 0.0–0.1)
Basophils Relative: 1 % (ref 0–1)
Eosinophils Absolute: 0 10*3/uL (ref 0.0–0.7)
Eosinophils Relative: 1 % (ref 0–5)
HCT: 36.7 % (ref 36.0–46.0)
Lymphocytes Relative: 32 % (ref 12–46)
MCH: 28.2 pg (ref 26.0–34.0)
MCHC: 33.5 g/dL (ref 30.0–36.0)
MCV: 84.2 fL (ref 78.0–100.0)
Monocytes Absolute: 0.7 10*3/uL (ref 0.1–1.0)
Platelets: 276 10*3/uL (ref 150–400)
RDW: 13.9 % (ref 11.5–15.5)
WBC: 6.2 10*3/uL (ref 4.0–10.5)

## 2012-11-17 ENCOUNTER — Ambulatory Visit (INDEPENDENT_AMBULATORY_CARE_PROVIDER_SITE_OTHER): Payer: 59 | Admitting: Behavioral Health

## 2012-11-17 ENCOUNTER — Encounter (HOSPITAL_COMMUNITY): Payer: Self-pay | Admitting: Behavioral Health

## 2012-11-17 DIAGNOSIS — F319 Bipolar disorder, unspecified: Secondary | ICD-10-CM
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
## 2012-12-16 ENCOUNTER — Ambulatory Visit (INDEPENDENT_AMBULATORY_CARE_PROVIDER_SITE_OTHER): Payer: 59 | Admitting: Behavioral Health

## 2012-12-16 DIAGNOSIS — F319 Bipolar disorder, unspecified: Secondary | ICD-10-CM

## 2012-12-17 ENCOUNTER — Encounter (HOSPITAL_COMMUNITY): Payer: Self-pay | Admitting: Behavioral Health

## 2012-12-17 NOTE — Progress Notes (Signed)
   THERAPIST PROGRESS NOTE  Session Time: 2:00  Participation Level: Active  Behavioral Response: CasualAlertpleasant  Type of Therapy: Individual Therapy  Treatment Goals addressed: Coping  Interventions: CBT  Summary: Haley Wheeler is a 22 y.o. female who presents with mild anxiety.   Suicidal/Homicidal: Nowithout intent/plan  Therapist Response: The client presented as bright and happy saying that she feels like she is doing well. She stated that she is still dating Colton and he still in jail. She is not sure when he will be released saying that there has to be another hearing. She states that she talks to regularly and that they still plan on getting married and having children together. We used that as a springboard to talk about healthy relationships with the client. She says that he says that he is not going to use drugs or drink alcohol after he gets out of jail and that he has begun reading his Bible while in jail. 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 We talked about her anger triggers. She indicates that one of his not being able to trust someone. She states that she trusts: Even though he did some things in the past that she probably should not trust him she feels that he is attempting to make changes in his life for both her and for his daughter. She indicated that another anger triggers for her is when people talk about religion. I asked her how she felt about her boyfriend talking about praying and reading about old and she tells me that she asked him not to talk about it. As a begun to pack that it appears that growing up she had family members who were very focused on taking she and her brothers a church but they live lives opposite of what they preached throughout the week which made her feel like religion is fake. She did say that she had done much better job of controlling her anger and irritation. She stated that she had begun to talk to  her former girlfriend again but only his friends and she felt like that has helped as she still harbors some bad feelings toward the former girlfriend and they appear to work those out. She denies having any romantic feelings for her saying [name]. She indicates she is being more respectful to her grandparents. I asked about any after she had made to look for work were applied to school and she has done neither. I asked her again to commit to looking for a job so that she can have some purpose in her life and to be more self supportive. She does contract for safety saying she has no thoughts of hurting herself or anyone else.  Plan: Return again in 4 weeks.  Diagnosis: Axis I: 296.8    Axis II: Deferred    French Ana, [facility] 12/17/2012

## 2012-12-29 ENCOUNTER — Ambulatory Visit (INDEPENDENT_AMBULATORY_CARE_PROVIDER_SITE_OTHER): Payer: 59 | Admitting: Psychiatry

## 2012-12-29 ENCOUNTER — Encounter (HOSPITAL_COMMUNITY): Payer: Self-pay | Admitting: Psychiatry

## 2012-12-29 VITALS — BP 96/81 | HR 89 | Ht 60.0 in | Wt 102.0 lb

## 2012-12-29 DIAGNOSIS — F3110 Bipolar disorder, current episode manic without psychotic features, unspecified: Secondary | ICD-10-CM

## 2012-12-29 DIAGNOSIS — F3189 Other bipolar disorder: Secondary | ICD-10-CM

## 2012-12-29 DIAGNOSIS — F319 Bipolar disorder, unspecified: Secondary | ICD-10-CM

## 2012-12-29 MED ORDER — OXCARBAZEPINE 300 MG PO TABS: ORAL_TABLET | ORAL | Status: DC
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
## 2013-01-15 ENCOUNTER — Encounter (HOSPITAL_COMMUNITY): Payer: Self-pay | Admitting: Behavioral Health

## 2013-01-15 ENCOUNTER — Ambulatory Visit (INDEPENDENT_AMBULATORY_CARE_PROVIDER_SITE_OTHER): Payer: 59 | Admitting: Behavioral Health

## 2013-01-15 DIAGNOSIS — F319 Bipolar disorder, unspecified: Secondary | ICD-10-CM
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
## 2013-02-19 ENCOUNTER — Ambulatory Visit (INDEPENDENT_AMBULATORY_CARE_PROVIDER_SITE_OTHER): Payer: 59 | Admitting: Behavioral Health

## 2013-02-19 DIAGNOSIS — F319 Bipolar disorder, unspecified: Secondary | ICD-10-CM

## 2013-02-20 ENCOUNTER — Encounter (HOSPITAL_COMMUNITY): Payer: Self-pay | Admitting: Behavioral Health

## 2013-02-20 NOTE — Progress Notes (Signed)
   THERAPIST PROGRESS NOTE  Session Time: 3:00  Participation Level: Active  Behavioral Response: CasualAlertAnxious  Type of Therapy: Individual Therapy  Treatment Goals addressed: Coping  Interventions: CBT  Summary: Haley Wheeler is a 22 y.o. female who presents with anxiety.   Suicidal/Homicidal: Nowithout intent/plan  Therapist Response: The client started the session by saying that she may be moving to [facility]. The plan is to move this weekend in with the female that she has been dating for one month and his 65 and 64-year-old sons. She stated that she has been down they're quite a bit in the past month and plans to make been with him. She reports that the female she had been dating will be in prison to at least February of 2015 and that relationship is not currently active. She also expressed concern that she may be pregnant saying that she  had sex with her current boyfriend approximately one month ago and has not started her period yet. She stated that her mother knows but no one else knows. I encouraged her to get a pregnancy test and again asked her to please have protected sex. She did express some anxiety about going to work she says that has been a healthy relationship so far. She stated that he has been drug and alcohol free for 10 or 11 years. She stated she has been clean for 6 months. She did talk about receiving a face book a message from one of her biological father's cousins who she said she had no contact with since he was 64 or 22 years old. She stated that she simply responded that her cousin did have the right person but made no effort to get to know her and is not sure if she will. She stated that she has very little trust with anyone from her biological father's side of the family and had no intention at this point to respond. We reviewed the clients coping skills for anxiety and depression. We talked about making better decisions. We did schedule an  appointment for one month out told her to please call 24 hours in advance she does not plan on coming back from [place] for the session. She did contract for safety saying she has no thoughts of hurting herself or anyone else. Plan: Return again in 4 weeks.  Diagnosis: Axis I: 296.9    Axis II: Deferred    Haley Wheeler, [facility] 02/20/2013

## 2013-03-03 ENCOUNTER — Encounter (HOSPITAL_COMMUNITY): Payer: Self-pay | Admitting: Psychiatry

## 2013-03-03 ENCOUNTER — Ambulatory Visit (INDEPENDENT_AMBULATORY_CARE_PROVIDER_SITE_OTHER): Payer: 59 | Admitting: Psychiatry

## 2013-03-03 VITALS — BP 96/57 | HR 59 | Ht 60.0 in | Wt 102.0 lb

## 2013-03-03 DIAGNOSIS — F3110 Bipolar disorder, current episode manic without psychotic features, unspecified: Secondary | ICD-10-CM

## 2013-03-03 DIAGNOSIS — F314 Bipolar disorder, current episode depressed, severe, without psychotic features: Secondary | ICD-10-CM

## 2013-03-03 DIAGNOSIS — F3189 Other bipolar disorder: Secondary | ICD-10-CM

## 2013-03-03 LAB — CBC WITH DIFFERENTIAL/PLATELET
Basophils Relative: 1 % (ref 0–1)
Eosinophils Absolute: 0 10*3/uL (ref 0.0–0.7)
MCH: 28.3 pg (ref 26.0–34.0)
MCHC: 33.9 g/dL (ref 30.0–36.0)
Neutrophils Relative %: 60 % (ref 43–77)
Platelets: 286 10*3/uL (ref 150–400)

## 2013-03-03 MED ORDER — OXCARBAZEPINE 300 MG PO TABS: ORAL_TABLET | ORAL | Status: DC
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
## 2013-03-04 LAB — DRUGS OF ABUSE SCREEN W/O ALC, ROUTINE URINE
Barbiturate Quant, Ur: NEGATIVE
Cocaine Metabolites: NEGATIVE
Creatinine,U: 180.9 mg/dL
Methadone: NEGATIVE
Opiate Screen, Urine: NEGATIVE
Propoxyphene: NEGATIVE

## 2013-03-06 ENCOUNTER — Telehealth (HOSPITAL_COMMUNITY): Payer: Self-pay

## 2013-03-06 NOTE — Telephone Encounter (Signed)
PT WOULD LIKE TO START OUT SLOWER ON THE BIPOLOR SHE HAS BEEN OFF THEM AND JUST RESTARTED AND NOW FEELING SICK IN THE AFTERNOON WITH MEDS

## 2013-03-06 NOTE — Telephone Encounter (Signed)
Called patient. Patient's grandmother picked up and reported some nausea. Advised her to decrease dose to 300 mg for 3 days, then increase to 300 mg BID for 7 days, then increase to 300 mg QAM and 600 mg QHS. The patient's grandmother reports that overall she has been doing well.

## 2013-03-30 ENCOUNTER — Ambulatory Visit (INDEPENDENT_AMBULATORY_CARE_PROVIDER_SITE_OTHER): Payer: 59 | Admitting: Behavioral Health

## 2013-03-30 DIAGNOSIS — F319 Bipolar disorder, unspecified: Secondary | ICD-10-CM

## 2013-03-31 ENCOUNTER — Encounter (HOSPITAL_COMMUNITY): Payer: Self-pay | Admitting: Behavioral Health
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
## 2013-04-03 ENCOUNTER — Ambulatory Visit (INDEPENDENT_AMBULATORY_CARE_PROVIDER_SITE_OTHER): Payer: 59 | Admitting: Psychiatry

## 2013-04-03 ENCOUNTER — Encounter (HOSPITAL_COMMUNITY): Payer: Self-pay | Admitting: Psychiatry

## 2013-04-03 VITALS — BP 105/83 | HR 92 | Ht 60.0 in | Wt 102.0 lb

## 2013-04-03 DIAGNOSIS — F3110 Bipolar disorder, current episode manic without psychotic features, unspecified: Secondary | ICD-10-CM

## 2013-04-03 DIAGNOSIS — F3189 Other bipolar disorder: Secondary | ICD-10-CM

## 2013-04-03 MED ORDER — OXCARBAZEPINE 300 MG PO TABS: ORAL_TABLET | ORAL | Status: DC
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
## 2013-04-11 ENCOUNTER — Encounter: Payer: Self-pay | Admitting: Emergency Medicine

## 2013-04-11 ENCOUNTER — Emergency Department (INDEPENDENT_AMBULATORY_CARE_PROVIDER_SITE_OTHER)
Admission: EM | Admit: 2013-04-11 | Discharge: 2013-04-11 | Disposition: A | Discharge status: Home / Self Care | Payer: Medicaid Other | Source: Home / Self Care | Attending: Family Medicine | Admitting: Family Medicine

## 2013-04-11 DIAGNOSIS — B356 Tinea cruris: Secondary | ICD-10-CM

## 2013-04-11 HISTORY — DX: Herpesviral infection, unspecified: B00.9

## 2013-04-11 MED ORDER — SERTACONAZOLE NITRATE 2 % EX CREA: 1.0000 "application " | TOPICAL_CREAM | Freq: Two times a day (BID) | CUTANEOUS | Status: DC

## 2013-04-11 MED ORDER — FLUCONAZOLE 150 MG PO TABS: 150.0000 mg | ORAL_TABLET | Freq: Once | ORAL | Status: DC

## 2013-04-11 NOTE — ED Provider Notes (Signed)
History    CSN: [id number] Arrival date & time 04/11/13  1701  First MD Initiated Contact with Patient 04/11/13 1714     Chief Complaint  Patient presents with  . Vaginitis      HPI Comments: Reports vaginal itching/burning with discharge x 5 days; has tried OTCs. Sexually active with partner reporting similar symptoms as of today.  No abdominal or pelvic pain.  Patient's last menstrual period was 03/20/2013.   Patient is a 22 y.o. female presenting with vaginal itching. The history is provided by the patient.  Vaginal Itching This is a new problem. Episode onset: 5 days ago. The problem occurs constantly. The problem has not changed since onset.Pertinent negatives include no abdominal pain. The symptoms are aggravated by intercourse. Nothing relieves the symptoms. Treatments tried: OTC cream. The treatment provided no relief.   Past Medical History  Diagnosis Date  . ADHD (attention deficit hyperactivity disorder)   . Anxiety   . Bipolar disorder   . H/O suicide attempt   . Headache(784.0)   . HSV-1 (herpes simplex virus 1) infection    Past Surgical History  Procedure Laterality Date  . Hand surgery     Family History  Problem Relation Age of Onset  . Depression Mother   . ADD / ADHD Father   . Alcohol abuse Father   . ADD / ADHD Brother   . Sexual abuse Other    History  Substance Use Topics  . Smoking status: Never Smoker   . Smokeless tobacco: Never Used     Comment: Patient plans to get electronic cigarette  . Alcohol Use: 0.5 oz/week    1 drink(s) per week     Comment: Last used  03/26/2013   OB History   Grav Para Term Preterm Abortions TAB SAB Ect Mult Living                 Review of Systems  Gastrointestinal: Negative for abdominal pain.  All other systems reviewed and are negative.    Allergies  Review of patient's allergies indicates no known allergies.  Home Medications   Current Outpatient Rx  Name  Route  Sig  Dispense  Refill  .  Oxcarbazepine (TRILEPTAL) 300 MG tablet      Take one tablet in the morning and two tablets at bedtime.   90 tablet   3     Discontinue previous prescriptions for this medica ...   . tizanidine (ZANAFLEX) 2 MG capsule   Oral   Take 2 mg by mouth 1 day or 1 dose.         . valACYclovir (VALTREX) 1000 MG tablet                BP 104/65  Pulse 82  Temp(Src) 98.1 F (36.7 C) (Oral)  Resp 16  Ht 5' (1.524 m)  Wt 104 lb (47.174 kg)  BMI 20.31 kg/m2  SpO2 100%  LMP 03/20/2013 Physical Exam  Nursing note and vitals reviewed. Constitutional: She is oriented to person, place, and time. She appears well-developed and well-nourished. No distress.  HENT:  Head: Normocephalic.  Eyes: Conjunctivae are normal. Pupils are equal, round, and reactive to light.  Neck: Neck supple.  Cardiovascular: Normal heart sounds.   Pulmonary/Chest: Breath sounds normal.  Abdominal: Bowel sounds are normal. She exhibits no distension. There is no tenderness.  Genitourinary: Vagina normal.    There is no rash, tenderness, lesion or injury on the right labia. There is no  rash, tenderness, lesion or injury on the left labia. No erythema or tenderness around the vagina. No foreign body around the vagina. No signs of injury around the vagina. No vaginal discharge found.  Perineal area has faintly erythematous macular eruption with well-defined edges as noted on diagram.  Vulva appears normal without lesions or erythema.  Vagina has normal mucosae without lesions.  There is menstrual blood in the vaginal vault but no vaginal discharge.  Cervix appears normal without lesions.  There is no discharge present in the cervical os; menstrual blood present in the os. Bimanual exam not performed.      Neurological: She is alert and oriented to person, place, and time.  Skin: Skin is warm and dry.    ED Course  Procedures  none  Labs Reviewed  GC/CHLAMYDIA PROBE AMP, GENITAL pending    1. Tinea cruris      MDM  Begin Ertaczo cream BID.  Diflucan 150mg , one dose orally GC/chlamydia pending May apply 1% hydrocortisone cream once or twice daily for skin irritation Followup with Family Doctor if not improved in one week.   Lattie Haw, MD 04/16/13 1029

## 2013-04-11 NOTE — ED Notes (Signed)
Reports vaginal itching/burning with discharge x 5 days; has tried OTCs. Sexually active with partner reporting similar symptoms as of today.

## 2013-04-13 LAB — GC/CHLAMYDIA PROBE AMP: CT Probe RNA: NEGATIVE

## 2013-05-05 ENCOUNTER — Ambulatory Visit (INDEPENDENT_AMBULATORY_CARE_PROVIDER_SITE_OTHER): Payer: 59 | Admitting: Behavioral Health

## 2013-05-05 DIAGNOSIS — F319 Bipolar disorder, unspecified: Secondary | ICD-10-CM

## 2013-05-06 ENCOUNTER — Encounter (HOSPITAL_COMMUNITY): Payer: Self-pay | Admitting: Behavioral Health
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
## 2013-06-04 ENCOUNTER — Emergency Department (HOSPITAL_BASED_OUTPATIENT_CLINIC_OR_DEPARTMENT_OTHER): Payer: Medicaid Other

## 2013-06-04 ENCOUNTER — Ambulatory Visit (HOSPITAL_COMMUNITY): Payer: Self-pay | Admitting: Behavioral Health

## 2013-06-04 ENCOUNTER — Encounter (HOSPITAL_BASED_OUTPATIENT_CLINIC_OR_DEPARTMENT_OTHER): Payer: Self-pay | Admitting: *Deleted

## 2013-06-04 ENCOUNTER — Emergency Department (HOSPITAL_BASED_OUTPATIENT_CLINIC_OR_DEPARTMENT_OTHER)
Admission: EM | Admit: 2013-06-04 | Discharge: 2013-06-04 | Disposition: A | Discharge status: Home / Self Care | Payer: Medicaid Other | Attending: Emergency Medicine | Admitting: Emergency Medicine

## 2013-06-04 DIAGNOSIS — Z8659 Personal history of other mental and behavioral disorders: Secondary | ICD-10-CM | POA: Insufficient documentation

## 2013-06-04 DIAGNOSIS — Z79899 Other long term (current) drug therapy: Secondary | ICD-10-CM | POA: Insufficient documentation

## 2013-06-04 DIAGNOSIS — Y92410 Unspecified street and highway as the place of occurrence of the external cause: Secondary | ICD-10-CM | POA: Insufficient documentation

## 2013-06-04 DIAGNOSIS — S4980XA Other specified injuries of shoulder and upper arm, unspecified arm, initial encounter: Secondary | ICD-10-CM | POA: Insufficient documentation

## 2013-06-04 DIAGNOSIS — Y9389 Activity, other specified: Secondary | ICD-10-CM | POA: Insufficient documentation

## 2013-06-04 DIAGNOSIS — Z8619 Personal history of other infectious and parasitic diseases: Secondary | ICD-10-CM | POA: Insufficient documentation

## 2013-06-04 DIAGNOSIS — S46909A Unspecified injury of unspecified muscle, fascia and tendon at shoulder and upper arm level, unspecified arm, initial encounter: Secondary | ICD-10-CM | POA: Insufficient documentation

## 2013-06-04 DIAGNOSIS — M25511 Pain in right shoulder: Secondary | ICD-10-CM

## 2013-06-04 MED ORDER — HYDROCODONE-ACETAMINOPHEN 5-325 MG PO TABS: 2.0000 | ORAL_TABLET | ORAL | Status: DC | PRN
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
## 2013-06-04 NOTE — ED Notes (Signed)
Patient transported to X-ray 

## 2013-06-10 NOTE — ED Provider Notes (Signed)
Medical screening examination/treatment/procedure(s) were performed by non-physician practitioner and as supervising physician I was immediately available for consultation/collaboration.   Candyce Churn, MD 06/10/13 [phone]

## 2013-07-03 ENCOUNTER — Encounter (HOSPITAL_COMMUNITY): Payer: Self-pay | Admitting: Psychiatry

## 2013-07-03 ENCOUNTER — Ambulatory Visit (INDEPENDENT_AMBULATORY_CARE_PROVIDER_SITE_OTHER): Payer: 59 | Admitting: Psychiatry

## 2013-07-03 VITALS — BP 118/69 | HR 63 | Ht 60.0 in | Wt 105.0 lb

## 2013-07-03 DIAGNOSIS — F314 Bipolar disorder, current episode depressed, severe, without psychotic features: Secondary | ICD-10-CM

## 2013-07-03 MED ORDER — LURASIDONE HCL 20 MG PO TABS: 20.0000 mg | ORAL_TABLET | ORAL | Status: DC
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
## 2013-07-16 ENCOUNTER — Encounter (HOSPITAL_COMMUNITY): Payer: Self-pay | Admitting: Behavioral Health

## 2013-07-16 ENCOUNTER — Ambulatory Visit (INDEPENDENT_AMBULATORY_CARE_PROVIDER_SITE_OTHER): Payer: 59 | Admitting: Behavioral Health

## 2013-07-16 ENCOUNTER — Encounter (INDEPENDENT_AMBULATORY_CARE_PROVIDER_SITE_OTHER): Payer: Self-pay

## 2013-07-16 DIAGNOSIS — F319 Bipolar disorder, unspecified: Secondary | ICD-10-CM

## 2013-07-16 NOTE — Progress Notes (Signed)
   THERAPIST PROGRESS NOTE  Session Time: 2:00  Participation Level: Active  Behavioral Response: CasualAlertpleasant  Type of Therapy: Individual Therapy  Treatment Goals addressed: Coping  Interventions: CBT  Summary: Haley Wheeler is a 22 y.o. female who presents with manic depressive disorder.   Suicidal/Homicidal: Nowithout intent/plan  Therapist Response: The client reports that the new medication that she has started has been beneficial in that she is significantly less irritable. She stated that her boyfriend and other family members have noticed a difference. She indicates that she still feels the irritation but recognizes that she does not always need to act on it. She reports no other recurrent major stressors. She does report that she and her boyfriend are actively attempting to become pregnant. The client stated she still does use marijuana a couple of times per month and stated that if she were to be drug tested today she would not be clean. She indicates that she uses alcohol once or twice a month and probably uses marijuana once or twice a month. She states that she is aware she cannot do either if she becomes pregnant. I attempted to explain to her the lifestyle change she could expect. The client continues to have little motivation in terms of looking for work opportunities or educational opportunities. She does contract for safety having no thoughts of hurting herself or anyone else. We did review coping skills for reducing irritability and anxiety.  Plan: Return again in 4 weeks.  Diagnosis: Axis I: 296.8    Axis II: Deferred    Haley Wheeler, LPC 07/16/2013

## 2013-07-23 ENCOUNTER — Ambulatory Visit (INDEPENDENT_AMBULATORY_CARE_PROVIDER_SITE_OTHER): Payer: 59 | Admitting: Psychiatry

## 2013-07-23 ENCOUNTER — Encounter (HOSPITAL_COMMUNITY): Payer: Self-pay | Admitting: Psychiatry

## 2013-07-23 ENCOUNTER — Encounter (INDEPENDENT_AMBULATORY_CARE_PROVIDER_SITE_OTHER): Payer: Self-pay

## 2013-07-23 VITALS — BP 121/67 | HR 72 | Ht 60.0 in | Wt 107.0 lb

## 2013-07-23 DIAGNOSIS — F319 Bipolar disorder, unspecified: Secondary | ICD-10-CM

## 2013-07-23 MED ORDER — LURASIDONE HCL 20 MG PO TABS: 20.0000 mg | ORAL_TABLET | ORAL | Status: DC
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
## 2013-07-24 ENCOUNTER — Telehealth (HOSPITAL_COMMUNITY): Payer: Self-pay

## 2013-07-24 NOTE — Telephone Encounter (Signed)
Acknowledged.

## 2013-08-18 ENCOUNTER — Encounter (HOSPITAL_COMMUNITY): Payer: Self-pay | Admitting: Behavioral Health

## 2013-08-18 ENCOUNTER — Encounter (INDEPENDENT_AMBULATORY_CARE_PROVIDER_SITE_OTHER): Payer: Self-pay

## 2013-08-18 ENCOUNTER — Ambulatory Visit (INDEPENDENT_AMBULATORY_CARE_PROVIDER_SITE_OTHER): Payer: 59 | Admitting: Behavioral Health

## 2013-08-18 DIAGNOSIS — F319 Bipolar disorder, unspecified: Secondary | ICD-10-CM
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
## 2013-08-27 ENCOUNTER — Encounter (HOSPITAL_COMMUNITY): Payer: Self-pay | Admitting: Psychiatry

## 2013-08-27 ENCOUNTER — Ambulatory Visit (INDEPENDENT_AMBULATORY_CARE_PROVIDER_SITE_OTHER): Payer: 59 | Admitting: Psychiatry

## 2013-08-27 VITALS — BP 101/75 | HR 77 | Ht 60.0 in | Wt 106.0 lb

## 2013-08-27 DIAGNOSIS — F319 Bipolar disorder, unspecified: Secondary | ICD-10-CM

## 2013-08-27 MED ORDER — LURASIDONE HCL 20 MG PO TABS
40.0000 mg | ORAL_TABLET | ORAL | Status: DC
Start: 2013-08-27 — End: 2013-08-31
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
## 2013-08-31 MED ORDER — LURASIDONE HCL 20 MG PO TABS: 20.0000 mg | ORAL_TABLET | ORAL | Status: DC

## 2013-09-21 ENCOUNTER — Encounter: Payer: Self-pay | Admitting: Emergency Medicine

## 2013-09-21 ENCOUNTER — Encounter (INDEPENDENT_AMBULATORY_CARE_PROVIDER_SITE_OTHER): Payer: Self-pay

## 2013-09-21 ENCOUNTER — Emergency Department
Admission: EM | Admit: 2013-09-21 | Discharge: 2013-09-21 | Disposition: A | Discharge status: Home / Self Care | Payer: Medicaid Other | Source: Home / Self Care | Attending: Family Medicine | Admitting: Family Medicine

## 2013-09-21 ENCOUNTER — Encounter (HOSPITAL_COMMUNITY): Payer: Self-pay | Admitting: Behavioral Health

## 2013-09-21 ENCOUNTER — Ambulatory Visit (INDEPENDENT_AMBULATORY_CARE_PROVIDER_SITE_OTHER): Payer: 59 | Admitting: Behavioral Health

## 2013-09-21 DIAGNOSIS — F319 Bipolar disorder, unspecified: Secondary | ICD-10-CM

## 2013-09-21 DIAGNOSIS — J069 Acute upper respiratory infection, unspecified: Secondary | ICD-10-CM

## 2013-09-21 MED ORDER — BENZONATATE 200 MG PO CAPS: 200.0000 mg | ORAL_CAPSULE | Freq: Every day | ORAL | Status: DC

## 2013-09-21 MED ORDER — FLUCONAZOLE 150 MG PO TABS: 150.0000 mg | ORAL_TABLET | Freq: Once | ORAL | Status: AC

## 2013-09-21 MED ORDER — AZITHROMYCIN 250 MG PO TABS: ORAL_TABLET | ORAL | Status: DC
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
## 2013-09-21 NOTE — ED Provider Notes (Signed)
CSN: [id number]     Arrival date & time 09/21/13  1603 History   First MD Initiated Contact with Patient 09/21/13 1622     Chief Complaint  Patient presents with  . Cough      HPI Comments: Patient developed URI symptoms one week ago with non-productive cough, followed by nasal congestion, myalgias, headache, chest tightness, and sore throat.  The history is provided by the patient.    Past Medical History  Diagnosis Date  . ADHD (attention deficit hyperactivity disorder)   . Anxiety   . Bipolar disorder   . H/O suicide attempt   . Headache(784.0)   . HSV-1 (herpes simplex virus 1) infection    Past Surgical History  Procedure Laterality Date  . Hand surgery     Family History  Problem Relation Age of Onset  . Depression Mother   . ADD / ADHD Father   . Alcohol abuse Father   . ADD / ADHD Brother   . Sexual abuse Other    History  Substance Use Topics  . Smoking status: Current Every Day Smoker -- 0.25 packs/day for 12 years    Types: Cigarettes    Last Attempt to Quit: 07/03/2012  . Smokeless tobacco: Never Used     Comment: Patient plans to get electronic cigarette  . Alcohol Use: 1.0 oz/week    2 drink(s) per week     Comment: Last used  08/15/2013   OB History   Grav Para Term Preterm Abortions TAB SAB Ect Mult Living                 Review of Systems + sore throat + cough No pleuritic pain No wheezing + nasal congestion + post-nasal drainage No sinus pain/pressure No itchy/red eyes No earache No hemoptysis No SOB No fever/chills No nausea No vomiting No abdominal pain No diarrhea No urinary symptoms No skin rash + fatigue + myalgias + headache Used OTC meds without relief  Allergies  Review of patient's allergies indicates no known allergies.  Home Medications   Current Outpatient Rx  Name  Route  Sig  Dispense  Refill  . azithromycin (ZITHROMAX Z-PAK) 250 MG tablet      Take 2 tabs today; then begin one tab once daily for 4 more  days.   6 each   0   . benzonatate (TESSALON) 200 MG capsule   Oral   Take 1 capsule (200 mg total) by mouth at bedtime. Take as needed for cough   12 capsule   0   . fluconazole (DIFLUCAN) 150 MG tablet   Oral   Take 1 tablet (150 mg total) by mouth once.   1 tablet   1   . Lurasidone HCl 20 MG TABS   Oral   Take 1 tablet (20 mg total) by mouth as directed.   30 tablet   1   . methocarbamol (ROBAXIN) 500 MG tablet      250 mg.          . valACYclovir (VALTREX) 1000 MG tablet                BP 116/78  Pulse 77  Temp(Src) 98.3 F (36.8 C) (Oral)  Resp 16  Ht 5' (1.524 m)  Wt 103 lb (46.72 kg)  BMI 20.12 kg/m2  SpO2 100%  LMP 08/08/2013 Physical Exam Nursing notes and Vital Signs reviewed. Appearance:  Patient appears healthy, stated age, and in no acute distress Eyes:  Pupils  are equal, round, and reactive to light and accomodation.  Extraocular movement is intact.  Conjunctivae are not inflamed  Ears:  Canals normal.  Tympanic membranes normal.  Nose:  Mildly congested turbinates.  No sinus tenderness.   Pharynx:  Normal Neck:  Supple.  Tender shotty posterior nodes are palpated bilaterally  Lungs:  Clear to auscultation.  Breath sounds are equal.  Chest:  Distinct tenderness to palpation over the mid-sternum.  Heart:  Regular rate and rhythm without murmurs, rubs, or gallops.  Abdomen:  Nontender without masses or hepatosplenomegaly.  Bowel sounds are present.  No CVA or flank tenderness.  Extremities:  No edema.  No calf tenderness Skin:  No rash present.   ED Course  Procedures  none          MDM   1. Acute upper respiratory infections of unspecified site    Begin Z-pack to cover atypicals.  Prescription written for Benzonatate [facility]) to take at bedtime for night-time cough.  Diflucan if vaginal yeast infection occurs. Take Mucinex D (1200mg  guaifenesin with decongestant) twice daily for congestion.  Increase fluid intake, rest. May use  Afrin nasal spray (or generic oxymetazoline) twice daily for about 5 days.  Also recommend using saline nasal spray several times daily and saline nasal irrigation (AYR is a common brand) Try warm salt water gargles for sore throat.  Stop all antihistamines for now, and other non-prescription cough/cold preparations. May take Ibuprofen 200mg , 4 tabs every 8 hours with food for chest/sternum discomfort.   Follow-up with family doctor if not improving about10 days    Lattie Haw, MD 09/21/13 1719

## 2013-09-21 NOTE — ED Notes (Signed)
Pt c/o cough, sneezing, chest hurts and facial pain x 1 wk. Denies fever. No OTC meds.

## 2013-09-28 ENCOUNTER — Ambulatory Visit (HOSPITAL_COMMUNITY): Payer: Self-pay | Admitting: Psychiatry

## 2013-10-22 ENCOUNTER — Encounter (HOSPITAL_COMMUNITY): Payer: Self-pay | Admitting: Behavioral Health

## 2013-10-22 ENCOUNTER — Encounter (INDEPENDENT_AMBULATORY_CARE_PROVIDER_SITE_OTHER): Payer: Self-pay

## 2013-10-22 ENCOUNTER — Ambulatory Visit (INDEPENDENT_AMBULATORY_CARE_PROVIDER_SITE_OTHER): Payer: 59 | Admitting: Behavioral Health

## 2013-10-22 DIAGNOSIS — F319 Bipolar disorder, unspecified: Secondary | ICD-10-CM

## 2013-10-22 NOTE — Progress Notes (Signed)
   THERAPIST PROGRESS NOTE  Session Time: 3:00  Participation Level: Active  Behavioral Response: CasualAlertAnxious  Type of Therapy: Individual Therapy  Treatment Goals addressed: Coping  Interventions: CBT  Summary: Haley LexKristian Wheeler is a 23 y.o. female who presents with anxiety.   Suicidal/Homicidal: Nowithout intent/plan  Therapist Response: The patient indicated that she had suicidal 2 days before after breaking up with her boyfriend. She indicated that they broke up because of a misunderstanding about infidelity and she was unfaithful thinking that he had been. She indicated that she was suicidal because he told her he did not want see her anymore so she smoked marijuana with a friend. She indicates that she has been on 6 or 7 times in the past month and a drunk or drank heavily at least 4 different times. She indicates that she has only taken her medication the past 3 days and did not take it prior to that probably 2-3 weeks. I reminded her of her actions and behaviors when off of her medication as well as when she is drunk or high.  I reminded her that it is time for her, only speaking 23 years old, to begin making some positive decisions about her life. I told her she needed to take a direction whether it's working, going to school and not counting on someone else to support roll the time. He also talked about the damage to alcohol and drug use to be doing her especially when she is taking her medicine. We also talked about healthy relationships.  The client does contract for safety saying she is not suicidal anymore but did say that she would let someone know if she were suicidal.  Plan: Return again in 4 weeks.  Diagnosis: Axis I: 296.8    Axis II: Deferred    Haley AnaETERS,Haley Wheeler, [facility] 10/22/2013

## 2013-11-10 ENCOUNTER — Ambulatory Visit (INDEPENDENT_AMBULATORY_CARE_PROVIDER_SITE_OTHER): Payer: 59 | Admitting: Psychiatry

## 2013-11-10 ENCOUNTER — Encounter (HOSPITAL_COMMUNITY): Payer: Self-pay | Admitting: Psychiatry

## 2013-11-10 VITALS — BP 112/67 | HR 76 | Wt 102.0 lb

## 2013-11-10 DIAGNOSIS — F319 Bipolar disorder, unspecified: Secondary | ICD-10-CM

## 2013-11-10 MED ORDER — LURASIDONE HCL 20 MG PO TABS: 20.0000 mg | ORAL_TABLET | ORAL | Status: DC
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
## 2013-11-24 ENCOUNTER — Ambulatory Visit (HOSPITAL_COMMUNITY): Payer: Self-pay | Admitting: Behavioral Health

## 2013-12-01 ENCOUNTER — Ambulatory Visit (HOSPITAL_COMMUNITY): Payer: Self-pay | Admitting: Behavioral Health

## 2013-12-07 ENCOUNTER — Ambulatory Visit (HOSPITAL_COMMUNITY): Payer: Self-pay | Admitting: Behavioral Health

## 2013-12-08 ENCOUNTER — Ambulatory Visit (HOSPITAL_COMMUNITY): Payer: Self-pay | Admitting: Behavioral Health

## 2013-12-12 ENCOUNTER — Encounter: Payer: Self-pay | Admitting: Emergency Medicine

## 2013-12-12 ENCOUNTER — Emergency Department (INDEPENDENT_AMBULATORY_CARE_PROVIDER_SITE_OTHER)
Admission: EM | Admit: 2013-12-12 | Discharge: 2013-12-12 | Disposition: A | Discharge status: Home / Self Care | Payer: Medicaid Other | Source: Home / Self Care | Attending: Family Medicine | Admitting: Family Medicine

## 2013-12-12 DIAGNOSIS — J020 Streptococcal pharyngitis: Secondary | ICD-10-CM

## 2013-12-12 DIAGNOSIS — J029 Acute pharyngitis, unspecified: Secondary | ICD-10-CM

## 2013-12-12 LAB — POCT RAPID STREP A (OFFICE): RAPID STREP A SCREEN: POSITIVE — AB

## 2013-12-12 MED ORDER — PENICILLIN V POTASSIUM 500 MG PO TABS: ORAL_TABLET | ORAL | Status: AC

## 2013-12-12 NOTE — ED Notes (Signed)
C/o sore throat 2 days; denies any other symptoms and states she has not tried anything OTC.

## 2013-12-12 NOTE — ED Provider Notes (Signed)
CSN: [id number]     Arrival date & time 12/12/13  1526 History   First MD Initiated Contact with Patient 12/12/13 1558     Chief Complaint  Patient presents with  . Sore Throat      HPI Comments: Patient complaints of a sore throat for two days, without other symptoms.  The history is provided by the patient.    Past Medical History  Diagnosis Date  . ADHD (attention deficit hyperactivity disorder)   . Anxiety   . Bipolar disorder   . H/O suicide attempt   . Headache(784.0)   . HSV-1 (herpes simplex virus 1) infection    Past Surgical History  Procedure Laterality Date  . Hand surgery     Family History  Problem Relation Age of Onset  . Depression Mother   . ADD / ADHD Father   . Alcohol abuse Father   . ADD / ADHD Brother   . Sexual abuse Other    History  Substance Use Topics  . Smoking status: Current Every Day Smoker -- 0.25 packs/day for 12 years    Types: Cigarettes    Last Attempt to Quit: 07/03/2012  . Smokeless tobacco: Never Used     Comment: Patient plans to get electronic cigarette  . Alcohol Use: 1.0 oz/week    2 drink(s) per week     Comment: Last used  08/15/2013   OB History   Grav Para Term Preterm Abortions TAB SAB Ect Mult Living                 Review of Systems + sore throat No cough No pleuritic pain No wheezing No nasal congestion No post-nasal drainage No sinus pain/pressure No itchy/red eyes No earache No hemoptysis No SOB No fever/chills No nausea No vomiting No abdominal pain No diarrhea No urinary symptoms No skin rash + fatigue No myalgias No headache Used OTC meds without relief  Allergies  Review of patient's allergies indicates no known allergies.  Home Medications   Current Outpatient Rx  Name  Route  Sig  Dispense  Refill  . fluconazole (DIFLUCAN) 150 MG tablet   Oral   Take 1 tablet (150 mg total) by mouth once.   1 tablet   1   . Lurasidone HCl 20 MG TABS   Oral   Take 1 tablet (20 mg total) by  mouth as directed.   30 tablet   1   . penicillin v potassium (VEETID) 500 MG tablet      Take one tab by mouth twice daily for 10 days   20 tablet   0   . valACYclovir (VALTREX) 1000 MG tablet                BP 106/69  Pulse 101  Temp(Src) 97.8 F (36.6 C) (Oral)  Resp 16  Ht 5' (1.524 m)  Wt 99 lb 8 oz (45.133 kg)  BMI 19.43 kg/m2  SpO2 99% Physical Exam Nursing notes and Vital Signs reviewed. Appearance:  Patient appears healthy, stated age, and in no acute distress Eyes:  Pupils are equal, round, and reactive to light and accomodation.  Extraocular movement is intact.  Conjunctivae are not inflamed  Ears:  Canals normal.  Tympanic membranes normal.  Nose:   Normal turbinates.  No sinus tenderness.   Pharynx:   Erythematous Neck:  Supple.   Tender enlarged anterior nodes are palpated bilaterally  Lungs:  Clear to auscultation.  Breath sounds are equal.  Heart:  Regular rate and rhythm without murmurs, rubs, or gallops.  Abdomen:  Nontender without masses or hepatosplenomegaly.  Bowel sounds are present.  No CVA or flank tenderness.  Extremities:  No edema.  No calf tenderness Skin:  No rash present.   ED Course  Procedures  none    Labs Reviewed  POCT RAPID STREP A (OFFICE) - Abnormal; Notable for the following:    Rapid Strep A Screen Positive (*)             MDM   1. Streptococcal sore throat    Begin penicillin. May take Ibuprofen 200mg , 3 or 4 tabs every 8 hours with food.  Try warm salt water gargles. Followup with Family Doctor if not improved in 10 days.    Lattie Haw, MD 12/13/13 [phone]

## 2013-12-12 NOTE — Discharge Instructions (Signed)
May take Ibuprofen 200mg , 3 or 4 tabs every 8 hours with food.  Try warm salt water gargles.

## 2013-12-16 ENCOUNTER — Encounter (HOSPITAL_COMMUNITY): Payer: Self-pay | Admitting: Behavioral Health

## 2013-12-16 ENCOUNTER — Ambulatory Visit (INDEPENDENT_AMBULATORY_CARE_PROVIDER_SITE_OTHER): Payer: 59 | Admitting: Behavioral Health

## 2013-12-16 ENCOUNTER — Encounter (INDEPENDENT_AMBULATORY_CARE_PROVIDER_SITE_OTHER): Payer: Self-pay

## 2013-12-16 DIAGNOSIS — F3110 Bipolar disorder, current episode manic without psychotic features, unspecified: Secondary | ICD-10-CM
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
## 2014-01-11 ENCOUNTER — Ambulatory Visit (HOSPITAL_COMMUNITY): Payer: Self-pay | Admitting: Psychiatry

## 2014-03-10 ENCOUNTER — Ambulatory Visit (HOSPITAL_COMMUNITY): Payer: Self-pay | Admitting: Psychiatry

## 2014-03-11 ENCOUNTER — Encounter (INDEPENDENT_AMBULATORY_CARE_PROVIDER_SITE_OTHER): Payer: Self-pay

## 2014-03-11 ENCOUNTER — Ambulatory Visit (INDEPENDENT_AMBULATORY_CARE_PROVIDER_SITE_OTHER): Payer: 59 | Admitting: Psychiatry

## 2014-03-11 VITALS — Ht 60.0 in | Wt 100.0 lb

## 2014-03-11 DIAGNOSIS — F314 Bipolar disorder, current episode depressed, severe, without psychotic features: Secondary | ICD-10-CM

## 2014-03-11 DIAGNOSIS — F319 Bipolar disorder, unspecified: Secondary | ICD-10-CM

## 2014-03-11 MED ORDER — LURASIDONE HCL 20 MG PO TABS: 20.0000 mg | ORAL_TABLET | ORAL | Status: AC

## 2014-03-11 NOTE — Progress Notes (Signed)
Patient ID: Haley Wheeler, female   DOB: 30-Nov-1990, 23 y.o.   MRN: [id number] [facility] Follow-up Outpatient Visit  Newell Hoffart [id number] 23 y.o.  03/11/2014  Chief Complaint: Anger and depression     History of Present Illness:   Patient returns for Medication Follow up and is diagnosed with bipolar disorder. She has been on latuda 20 mg but she's not taking for the last few months. Says that she's sleeping reasonable but gets impulsive and irritable for which she wants to get back on meds. Showed anger towards her boyfriend and feels meds need to be reinstated.   She has been described been diagnosed with bipolar and multiple hospitalization 2 years ago since then she has not been hospitalized. Says that in the past issues in particular with girlfriend and she had  suicidal thoughts has had multiple admissions in the past  Currently she is going to sleep issues and ager with depressive mood at times and wants to go back on medication.  She reported not having any side effects or involuntary movements when she takes the medication.  Currently unemployed and living with grand parents.  There is no significant anxiety symptoms  Modifying factors are the medications she does take medication does help  Past Medical History  Diagnosis Date  . ADHD (attention deficit hyperactivity disorder)   . Anxiety   . Bipolar disorder   . H/O suicide attempt   . Headache(784.0)   . HSV-1 (herpes simplex virus 1) infection    Family History  Problem Relation Age of Onset  . Depression Mother   . ADD / ADHD Father   . Alcohol abuse Father   . ADD / ADHD Brother   . Sexual abuse Other     Outpatient Encounter Prescriptions as of 03/11/2014  Medication Sig  . fluconazole (DIFLUCAN) 150 MG tablet Take 1 tablet (150 mg total) by mouth once.  . Lurasidone HCl 20 MG TABS Take 1 tablet (20 mg total) by mouth as directed.  . penicillin v potassium (VEETID) 500 MG tablet Take one  tab by mouth twice daily for 10 days  . valACYclovir (VALTREX) 1000 MG tablet   . [DISCONTINUED] Lurasidone HCl 20 MG TABS Take 1 tablet (20 mg total) by mouth as directed.    Recent Results (from the past 2160 hour(s))  POCT RAPID STREP A (OFFICE)     Status: Abnormal   Collection Time    12/12/13  3:51 PM      Result Value Ref Range   Rapid Strep A Screen Positive (*) Negative    Ht 5' (1.524 m)  Wt 100 lb (45.36 kg)  BMI 19.53 kg/m2   Review of Systems  Gastrointestinal: Negative for nausea and vomiting.  Psychiatric/Behavioral: Positive for depression. The patient is nervous/anxious.     Mental Status Examination   Alert oriented and cooperative. Eye contact reasonable intelligence and is somewhat questionable considering she's not taking medications mood is euthymic. There is no delusion or psychotic symptoms. some uncomfortable around people or in crowds but otherwise she is otherwise not hopelessness or suicidal does not endorse feeling as being watched.  Otherwise oriented to time place and person.  Diagnosis: Bipolar disorder type I manic episodes in the past according to history  Treatment Plan:   Reinstate Latuda 20mg  discussed side effects also informed to stop using marijuana she as stated that she uses marijuana infrequently informed marijuana  Will make medication  ineffective and she said she will try to  work on that.  Pertinent Labs and Relevant Prior Notes reviewed. Medication Side effects, benefits and risks reviewed/discussed with Patient. Time given for patient to respond and asks questions regarding the Diagnosis and Medications. Safety concerns and to report to ER if suicidal or call 911. Relevant Medications refilled or called in to pharmacy. Discussed weight maintenance and Sleep Hygiene. Follow up with Primary care provider in regards to Medical conditions. Recommend compliance with medications and follow up office appointments. Discussed to avail  opportunity to consider or/and continue Individual therapy with Counselor. Greater than 50% of time was spend in counseling and coordination of care with the patient.  Schedule for Follow up visit in 2 months or call in earlier as necessary.  Thresa RossAKHTAR, Jomes Giraldo, MD 03/11/2014

## 2014-05-11 ENCOUNTER — Ambulatory Visit (HOSPITAL_COMMUNITY): Payer: Self-pay | Admitting: Psychiatry
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
